# Patient Record
Sex: Male | Born: 1980 | Race: Black or African American | Hispanic: No | Marital: Married | Smoking: Current some day smoker
Health system: Southern US, Community
[De-identification: ages and names within clinical notes are randomized; demographics above are authoritative.]

---

## 2015-09-08 ENCOUNTER — Emergency Department (HOSPITAL_BASED_OUTPATIENT_CLINIC_OR_DEPARTMENT_OTHER)
Admission: EM | Admit: 2015-09-08 | Discharge: 2015-09-08 | Disposition: A | Discharge status: Home / Self Care | Payer: BLUE CROSS/BLUE SHIELD | Attending: Emergency Medicine | Admitting: Emergency Medicine

## 2015-09-08 ENCOUNTER — Encounter (HOSPITAL_BASED_OUTPATIENT_CLINIC_OR_DEPARTMENT_OTHER): Payer: Self-pay | Admitting: *Deleted

## 2015-09-08 ENCOUNTER — Emergency Department (HOSPITAL_BASED_OUTPATIENT_CLINIC_OR_DEPARTMENT_OTHER): Payer: BLUE CROSS/BLUE SHIELD

## 2015-09-08 DIAGNOSIS — S93401A Sprain of unspecified ligament of right ankle, initial encounter: Secondary | ICD-10-CM

## 2015-09-08 DIAGNOSIS — Y92828 Other wilderness area as the place of occurrence of the external cause: Secondary | ICD-10-CM | POA: Insufficient documentation

## 2015-09-08 DIAGNOSIS — Y9302 Activity, running: Secondary | ICD-10-CM | POA: Diagnosis not present

## 2015-09-08 DIAGNOSIS — X501XXA Overexertion from prolonged static or awkward postures, initial encounter: Secondary | ICD-10-CM | POA: Diagnosis not present

## 2015-09-08 DIAGNOSIS — F17200 Nicotine dependence, unspecified, uncomplicated: Secondary | ICD-10-CM | POA: Insufficient documentation

## 2015-09-08 DIAGNOSIS — Y990 Civilian activity done for income or pay: Secondary | ICD-10-CM | POA: Insufficient documentation

## 2015-09-08 DIAGNOSIS — S99921A Unspecified injury of right foot, initial encounter: Secondary | ICD-10-CM | POA: Diagnosis present

## 2015-09-08 NOTE — ED Provider Notes (Signed)
CSN: [id number]     Arrival date & time 09/08/15  1911 History   By signing my name below, I, Suzan Slick. Elon Spanner, attest that this documentation has been prepared under the direction and in the presence of Lyndal Pulley, MD.  Electronically Signed: Suzan Slick. Elon Spanner, ED Scribe. 09/08/2015. 7:56 PM.   Chief Complaint  Patient presents with  . Foot Injury   The history is provided by the patient. No language interpreter was used.    HPI Comments: Shaun Bates is a 35 y.o. male without any pertinent past medical history who presents to the Emergency Department here for constant, unchanged R foot pain with associated swelling x 1 day. Pt states he was running up a hill and jumped over a bush resulting in his R foot inverting. Pt states he was able to ambulate afterwards but with some difficulty. Discomfort is exacerbated with movement. No alleviating factors at this time. No OTC medications or home remedies attempted prior to arrival. No recent fever or chills.  PCP: No primary care provider on file.    History reviewed. No pertinent past medical history. History reviewed. No pertinent past surgical history. No family history on file. Social History  Substance Use Topics  . Smoking status: Current Some Day Smoker  . Smokeless tobacco: None  . Alcohol Use: Yes     Comment: occ    Review of Systems  Constitutional: Negative for fever and chills.  Musculoskeletal: Positive for joint swelling and arthralgias.  All other systems reviewed and are negative.     Allergies  Review of patient's allergies indicates no known allergies.  Home Medications   Prior to Admission medications   Not on File   Triage Vitals: BP 152/85 mmHg  Pulse 103  Temp(Src) 98.8 F (37.1 C) (Oral)  Resp 18  Ht 6' 2.5" (1.892 m)  Wt 181 lb (82.101 kg)  BMI 22.94 kg/m2  SpO2 99%   Physical Exam  Constitutional: He is oriented to person, place, and time. He appears well-developed and well-nourished. No  distress.  HENT:  Head: Normocephalic and atraumatic.  Eyes: Conjunctivae and EOM are normal.  Neck: Normal range of motion. Neck supple. No tracheal deviation present.  Cardiovascular: Normal rate and regular rhythm.   Pulmonary/Chest: Effort normal. No respiratory distress.  Abdominal: Soft. He exhibits no distension.  Musculoskeletal: Normal range of motion. He exhibits tenderness.  No AP instability of joint. Lateral malleolus swelling and tenderness noted.  Neurological: He is alert and oriented to person, place, and time.  Skin: Skin is warm and dry.  Psychiatric: He has a normal mood and affect.  Nursing note and vitals reviewed.   ED Course  Procedures (including critical care time)  DIAGNOSTIC STUDIES: Oxygen Saturation is 99% on RA, Normal by my interpretation.    COORDINATION OF CARE: 7:55 PM- Will order imaging .Discussed treatment plan with pt at bedside and pt agreed to plan.     Labs Review Labs Reviewed - No data to display  Imaging Review Dg Ankle Complete Right  09/08/2015  CLINICAL DATA:  Twisting ankle injury after jumping over a pushed about 5 hours prior to imaging. EXAM: RIGHT ANKLE - COMPLETE 3+ VIEW COMPARISON:  None. FINDINGS: Abnormal soft tissue swelling overlying the lateral malleolus without underlying fracture identified. Plafond and talar dome appear intact. Medial malleolus unremarkable. Potential small effusion of the tibiotalar joint. IMPRESSION: 1. Soft tissue swelling over the lateral malleolus without underlying fracture identified. 2. Potential small effusion of the tibiotalar  joint. Electronically Signed   By: Gaylyn RongWalter  Liebkemann M.D.   On: 09/08/2015 19:44   I have personally reviewed and evaluated these images and lab results as part of my medical decision-making.   EKG Interpretation None      MDM   Final diagnoses:  Right ankle sprain, initial encounter    35 y.o. male presents with right ankle sprain after inversion. No bony  injury on XR. Pt given instructions for supportive care including NSAIDs, rest, ice, compression, and elevation to help alleviate symptoms.   I personally performed the services described in this documentation, which was scribed in my presence. The recorded information has been reviewed and is accurate.      Lyndal Pulleyaniel Glayds [place], MD 09/10/15 [phone]

## 2015-09-08 NOTE — Discharge Instructions (Signed)
Ankle Sprain °An ankle sprain is an injury to the strong, fibrous tissues (ligaments) that hold the bones of your ankle joint together.  °CAUSES °An ankle sprain is usually caused by a fall or by twisting your ankle. Ankle sprains most commonly occur when you step on the outer edge of your foot, and your ankle turns inward. People who participate in sports are more prone to these types of injuries.  °SYMPTOMS  °· Pain in your ankle. The pain may be present at rest or only when you are trying to stand or walk. °· Swelling. °· Bruising. Bruising may develop immediately or within 1 to 2 days after your injury. °· Difficulty standing or walking, particularly when turning corners or changing directions. °DIAGNOSIS  °Your caregiver will ask you details about your injury and perform a physical exam of your ankle to determine if you have an ankle sprain. During the physical exam, your caregiver will press on and apply pressure to specific areas of your foot and ankle. Your caregiver will try to move your ankle in certain ways. An X-ray exam may be done to be sure a bone was not broken or a ligament did not separate from one of the bones in your ankle (avulsion fracture).  °TREATMENT  °Certain types of braces can help stabilize your ankle. Your caregiver can make a recommendation for this. Your caregiver may recommend the use of medicine for pain. If your sprain is severe, your caregiver may refer you to a surgeon who helps to restore function to parts of your skeletal system (orthopedist) or a physical therapist. °HOME CARE INSTRUCTIONS  °· Apply ice to your injury for 1-2 days or as directed by your caregiver. Applying ice helps to reduce inflammation and pain. °· Put ice in a plastic bag. °· Place a towel between your skin and the bag. °· Leave the ice on for 15-20 minutes at a time, every 2 hours while you are awake. °· Only take over-the-counter or prescription medicines for pain, discomfort, or fever as directed by  your caregiver. °· Elevate your injured ankle above the level of your heart as much as possible for 2-3 days. °· If your caregiver recommends crutches, use them as instructed. Gradually put weight on the affected ankle. Continue to use crutches or a cane until you can walk without feeling pain in your ankle. °· If you have a plaster splint, wear the splint as directed by your caregiver. Do not rest it on anything harder than a pillow for the first 24 hours. Do not put weight on it. Do not get it wet. You may take it off to take a shower or bath. °· You may have been given an elastic bandage to wear around your ankle to provide support. If the elastic bandage is too tight (you have numbness or tingling in your foot or your foot becomes cold and blue), adjust the bandage to make it comfortable. °· If you have an air splint, you may blow more air into it or let air out to make it more comfortable. You may take your splint off at night and before taking a shower or bath. Wiggle your toes in the splint several times per day to decrease swelling. °SEEK MEDICAL CARE IF:  °· You have rapidly increasing bruising or swelling. °· Your toes feel extremely cold or you lose feeling in your foot. °· Your pain is not relieved with medicine. °SEEK IMMEDIATE MEDICAL CARE IF: °· Your toes are numb or blue. °·   You have severe pain that is increasing. °MAKE SURE YOU:  °· Understand these instructions. °· Will watch your condition. °· Will get help right away if you are not doing well or get worse. °  °This information is not intended to replace advice given to you by your health care provider. Make sure you discuss any questions you have with your health care provider. °  °Document Released: 02/12/2005 Document Revised: 03/05/2014 Document Reviewed: 02/24/2011 °Elsevier Interactive Patient Education ©2016 [employer] °RICE for Routine Care of Injuries °The routine care of many injuries includes rest, ice, compression, and elevation  (RICE therapy). RICE therapy is often recommended for injuries to soft tissues, such as a muscle strain, ligament injuries, bruises, and overuse injuries. It can also be used for some bony injuries. Using RICE therapy can help to relieve pain, lessen swelling, and enable your body to heal. °Rest °Rest is required to allow your body to heal. This usually involves reducing your normal activities and avoiding use of the injured part of your body. Generally, you can return to your normal activities when you are comfortable and have been given permission by your health care provider. °Ice °Icing your injury helps to keep the swelling down, and it lessens pain. Do not apply ice directly to your skin. °· Put ice in a plastic bag. °· Place a towel between your skin and the bag. °· Leave the ice on for 20 minutes, 2-3 times a day. °Do this for as long as you are directed by your health care provider. °Compression °Compression means putting pressure on the injured area. Compression helps to keep swelling down, gives support, and helps with discomfort. Compression may be done with an elastic bandage. If an elastic bandage has been applied, follow these general tips: °· Remove and reapply the bandage every 3-4 hours or as directed by your health care provider. °· Make sure the bandage is not wrapped too tightly, because this can cut off circulation. If part of your body beyond the bandage becomes blue, numb, cold, swollen, or more painful, your bandage is most likely too tight. If this occurs, remove your bandage and reapply it more loosely. °· See your health care provider if the bandage seems to be making your problems worse rather than better. °Elevation °Elevation means keeping the injured area raised. This helps to lessen swelling and decrease pain. If possible, your injured area should be elevated at or above the level of your heart or the center of your chest. °WHEN SHOULD I SEEK MEDICAL CARE? °You should seek medical  care if: °· Your pain and swelling continue. °· Your symptoms are getting worse rather than improving. °These symptoms may indicate that further evaluation or further X-rays are needed. Sometimes, X-rays may not show a small broken bone (fracture) until a number of days later. Make a follow-up appointment with your health care provider. °WHEN SHOULD I SEEK IMMEDIATE MEDICAL CARE? °You should seek immediate medical care if: °· You have sudden severe pain at or below the area of your injury. °· You have redness or increased swelling around your injury. °· You have tingling or numbness at or below the area of your injury that does not improve after you remove the elastic bandage. °  °This information is not intended to replace advice given to you by your health care provider. Make sure you discuss any questions you have with your health care provider. °  °Document Released: 05/27/2000 Document Revised: 11/03/2014 Document Reviewed: 01/20/2014 °Elsevier Interactive Patient Education ©2016 [employer] ° °

## 2015-09-08 NOTE — ED Notes (Signed)
Twisted his right ankle earlier today. [place] [employer]. No UDS required.

## 2015-09-08 NOTE — ED Notes (Signed)
Patient transported to X-ray 

## 2015-09-08 NOTE — ED Notes (Signed)
CMS intact before and after. Pt tolerated well. Pt had no questions.  

## 2017-10-17 IMAGING — DX DG ANKLE COMPLETE 3+V*R*
3 series · 3 of 3 positions shown · non-contrast
Comparison: None.

CLINICAL DATA: Twisting ankle injury after jumping over a pushed
about 5 hours prior to imaging.

EXAM:
RIGHT ANKLE - COMPLETE 3+ VIEW

[ankle ap]
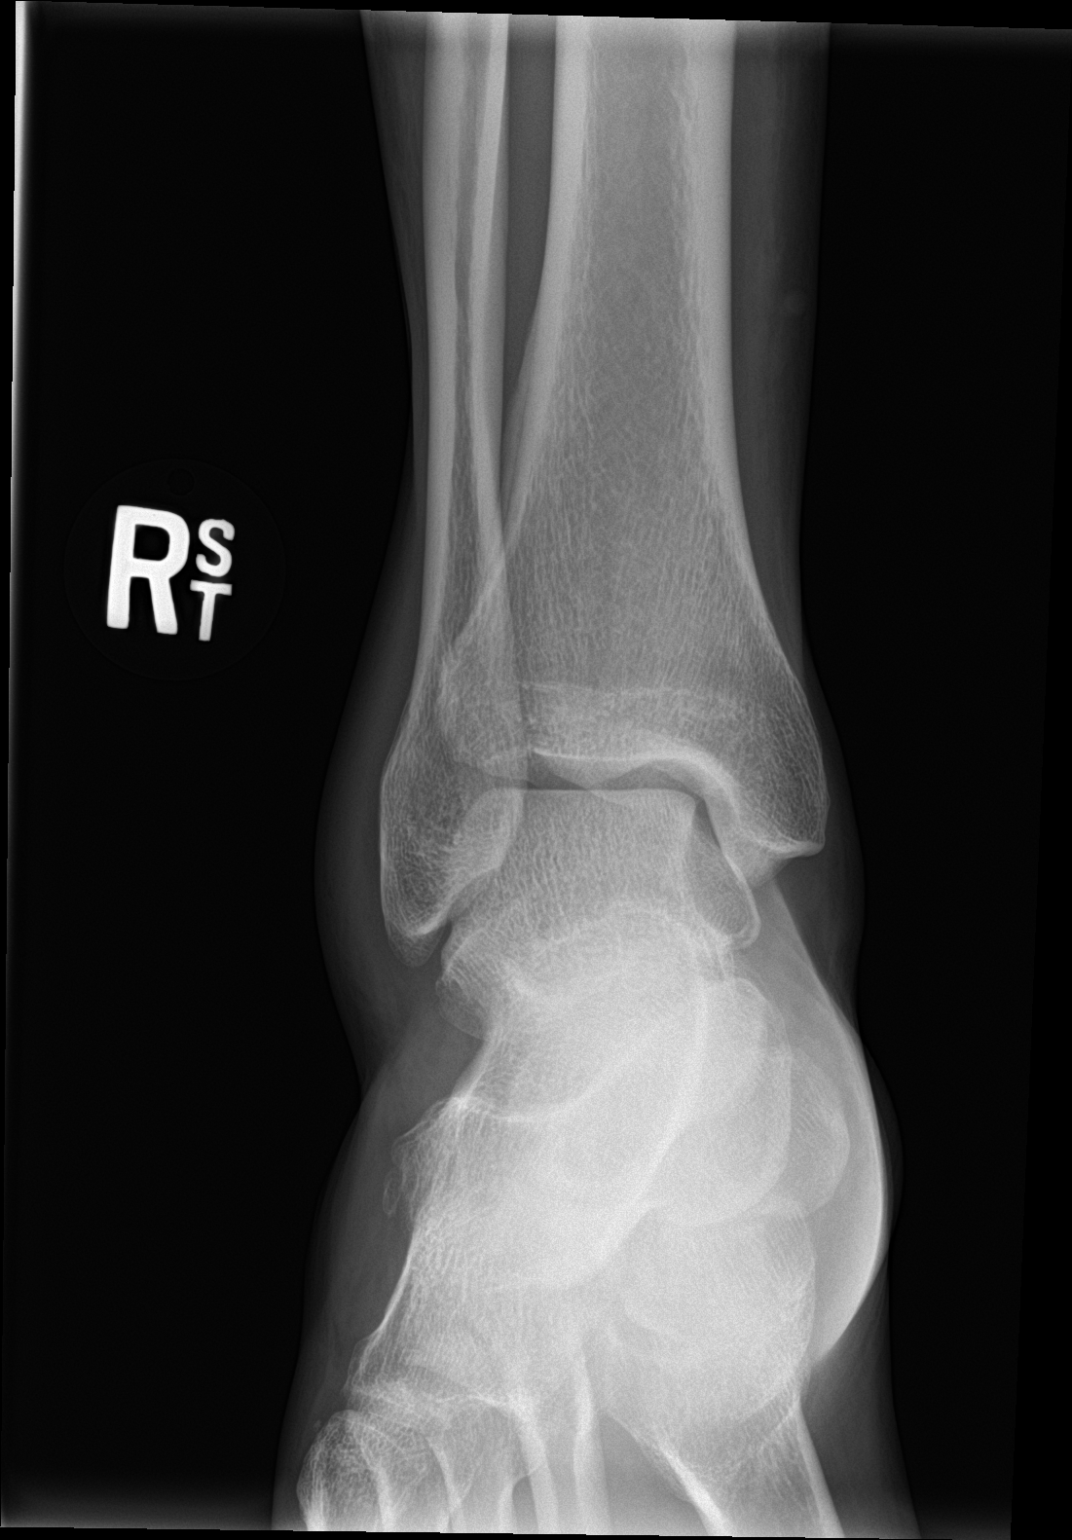

[ankle obl]
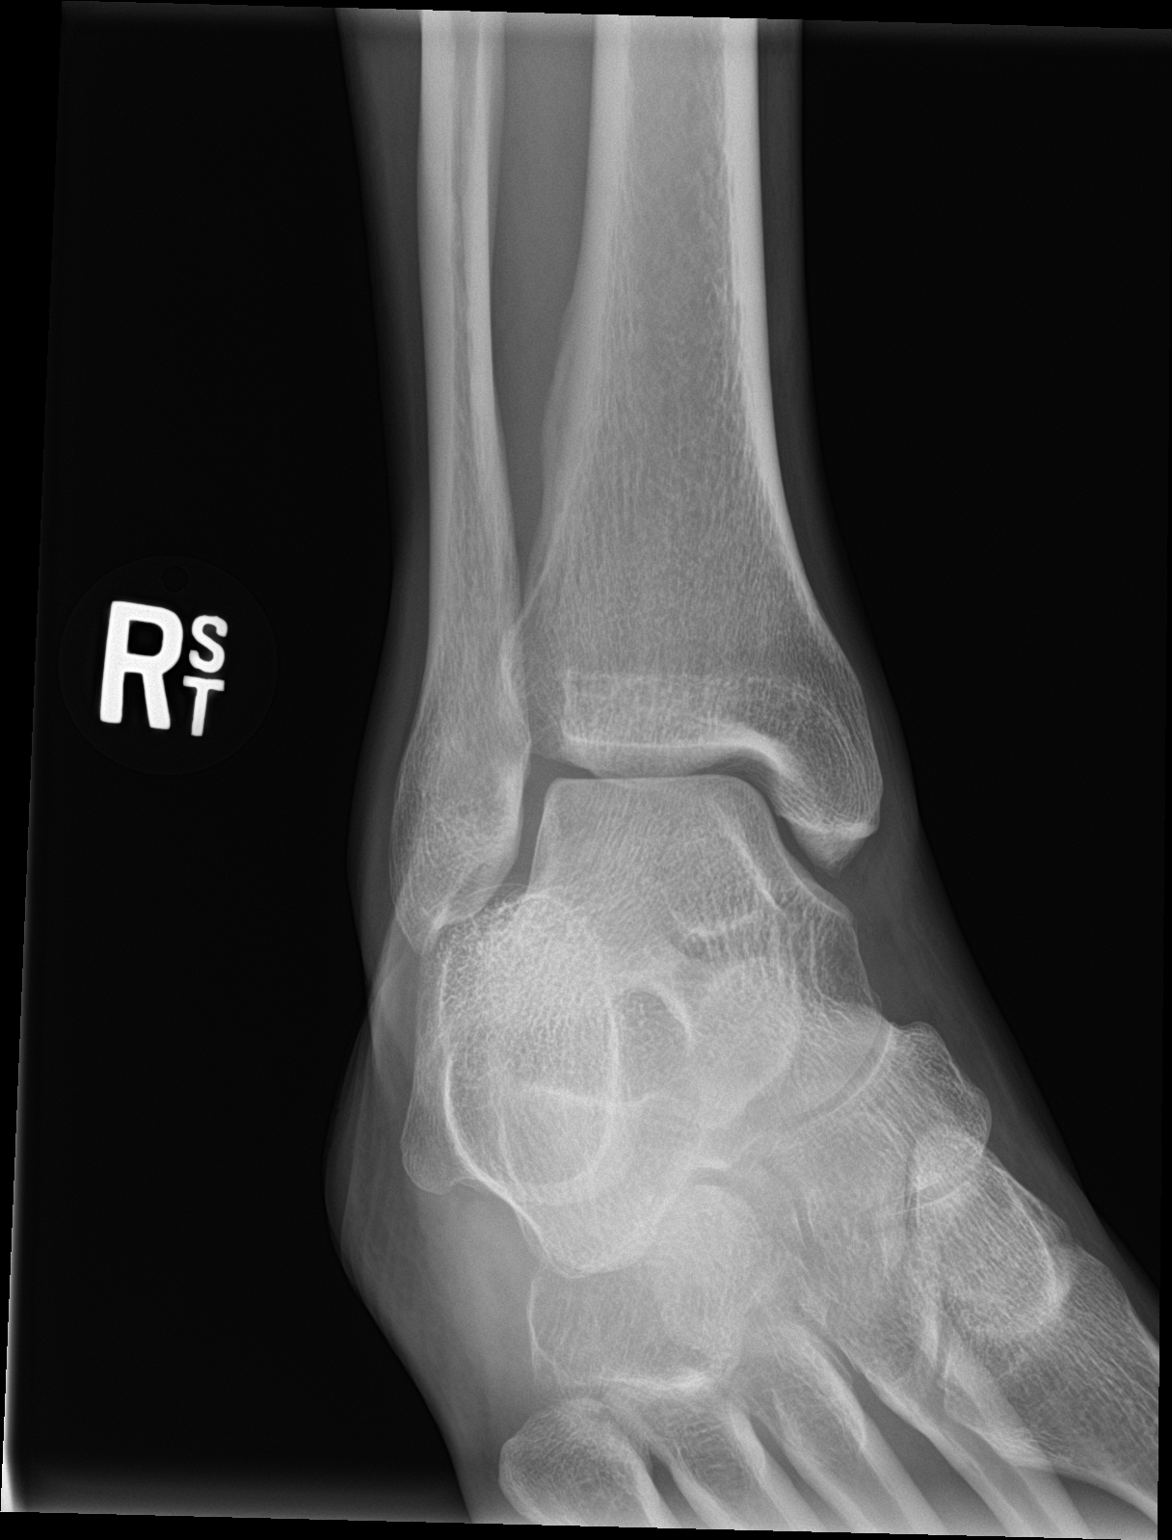

[ankle lat]
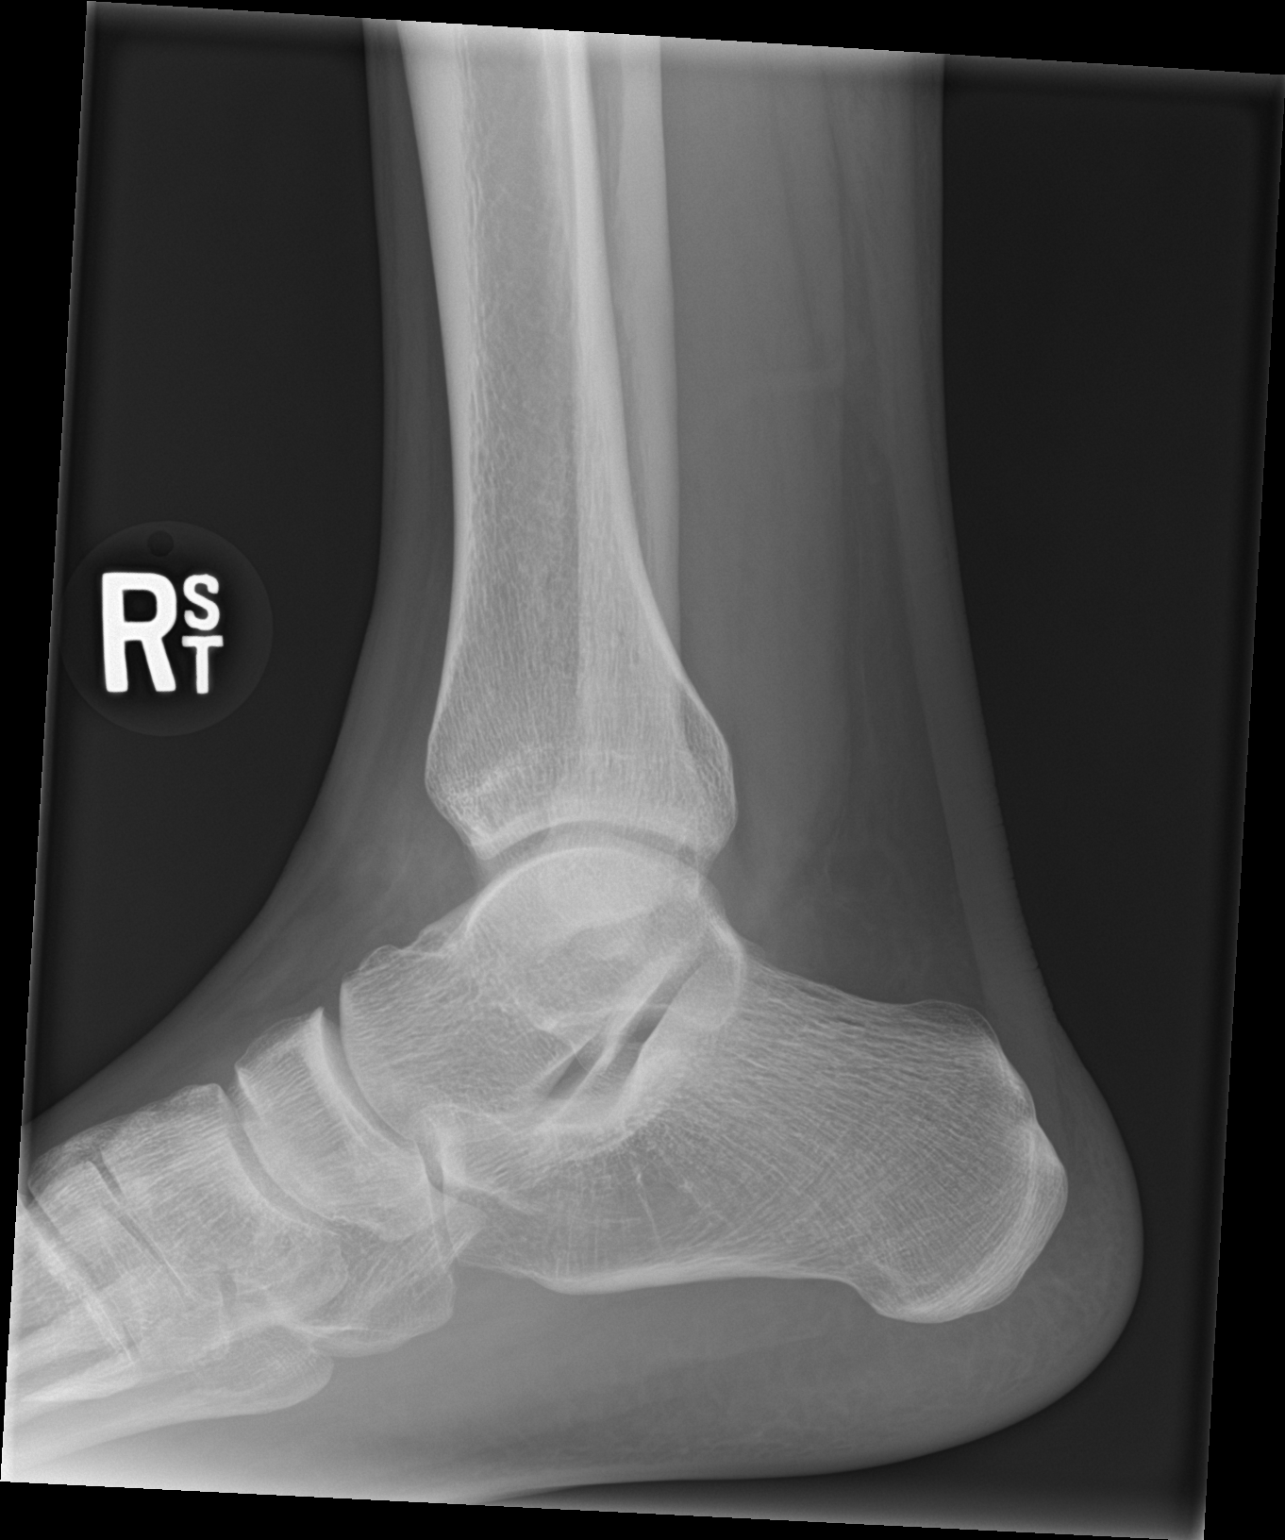

[3 of 3 positions shown; findings below may reference images not displayed]

FINDINGS: Abnormal soft tissue swelling overlying the lateral malleolus
without underlying fracture identified. Plafond and talar dome
appear intact. Medial malleolus unremarkable. Potential small
effusion of the tibiotalar joint.
IMPRESSION: 1. Soft tissue swelling over the lateral malleolus without
underlying fracture identified.
2. Potential small effusion of the tibiotalar joint.
